# Patient Record
Sex: Female | Born: 1955 | Race: Asian | Hispanic: No | Marital: Married | Smoking: Never smoker
Health system: Southern US, Community
[De-identification: ages and names within clinical notes are randomized; demographics above are authoritative.]

## PROBLEM LIST (undated history)

## (undated) DIAGNOSIS — K759 Inflammatory liver disease, unspecified: Secondary | ICD-10-CM

## (undated) DIAGNOSIS — N939 Abnormal uterine and vaginal bleeding, unspecified: Secondary | ICD-10-CM

## (undated) DIAGNOSIS — Z79890 Hormone replacement therapy (postmenopausal): Secondary | ICD-10-CM

## (undated) DIAGNOSIS — D249 Benign neoplasm of unspecified breast: Secondary | ICD-10-CM

## (undated) DIAGNOSIS — E785 Hyperlipidemia, unspecified: Secondary | ICD-10-CM

## (undated) DIAGNOSIS — I10 Essential (primary) hypertension: Secondary | ICD-10-CM

## (undated) DIAGNOSIS — T7840XA Allergy, unspecified, initial encounter: Secondary | ICD-10-CM

## (undated) HISTORY — PX: BREAST BIOPSY: SHX20

---

## 2015-12-14 DIAGNOSIS — E785 Hyperlipidemia, unspecified: Secondary | ICD-10-CM | POA: Diagnosis not present

## 2015-12-14 DIAGNOSIS — Z79899 Other long term (current) drug therapy: Secondary | ICD-10-CM | POA: Diagnosis not present

## 2015-12-14 DIAGNOSIS — K640 First degree hemorrhoids: Secondary | ICD-10-CM | POA: Diagnosis not present

## 2015-12-14 DIAGNOSIS — Z79890 Hormone replacement therapy (postmenopausal): Secondary | ICD-10-CM | POA: Diagnosis not present

## 2015-12-14 DIAGNOSIS — I10 Essential (primary) hypertension: Secondary | ICD-10-CM | POA: Diagnosis not present

## 2015-12-14 DIAGNOSIS — D125 Benign neoplasm of sigmoid colon: Secondary | ICD-10-CM | POA: Diagnosis not present

## 2015-12-14 DIAGNOSIS — Z1211 Encounter for screening for malignant neoplasm of colon: Secondary | ICD-10-CM | POA: Diagnosis present

## 2015-12-15 ENCOUNTER — Ambulatory Visit: Payer: Managed Care, Other (non HMO) | Admitting: Anesthesiology

## 2015-12-15 ENCOUNTER — Encounter: Payer: Self-pay | Admitting: Anesthesiology

## 2015-12-15 ENCOUNTER — Ambulatory Visit
Admission: RE | Admit: 2015-12-15 | Discharge: 2015-12-15 | Disposition: A | Discharge status: Home / Self Care | Payer: Managed Care, Other (non HMO) | Source: Ambulatory Visit | Attending: Unknown Physician Specialty | Admitting: Unknown Physician Specialty

## 2015-12-15 ENCOUNTER — Encounter
Admission: RE | Disposition: A | Discharge status: Home / Self Care | Payer: Self-pay | Source: Ambulatory Visit | Attending: Unknown Physician Specialty

## 2015-12-15 DIAGNOSIS — D125 Benign neoplasm of sigmoid colon: Secondary | ICD-10-CM | POA: Insufficient documentation

## 2015-12-15 DIAGNOSIS — K640 First degree hemorrhoids: Secondary | ICD-10-CM | POA: Insufficient documentation

## 2015-12-15 DIAGNOSIS — Z1211 Encounter for screening for malignant neoplasm of colon: Secondary | ICD-10-CM | POA: Insufficient documentation

## 2015-12-15 DIAGNOSIS — I10 Essential (primary) hypertension: Secondary | ICD-10-CM | POA: Insufficient documentation

## 2015-12-15 DIAGNOSIS — Z79890 Hormone replacement therapy (postmenopausal): Secondary | ICD-10-CM | POA: Insufficient documentation

## 2015-12-15 DIAGNOSIS — E785 Hyperlipidemia, unspecified: Secondary | ICD-10-CM | POA: Insufficient documentation

## 2015-12-15 DIAGNOSIS — Z79899 Other long term (current) drug therapy: Secondary | ICD-10-CM | POA: Insufficient documentation

## 2015-12-15 HISTORY — PX: COLONOSCOPY WITH PROPOFOL: SHX5780

## 2015-12-15 HISTORY — DX: Allergy, unspecified, initial encounter: T78.40XA

## 2015-12-15 HISTORY — DX: Hormone replacement therapy: Z79.890

## 2015-12-15 HISTORY — DX: Inflammatory liver disease, unspecified: K75.9

## 2015-12-15 HISTORY — DX: Abnormal uterine and vaginal bleeding, unspecified: N93.9

## 2015-12-15 HISTORY — DX: Essential (primary) hypertension: I10

## 2015-12-15 HISTORY — DX: Benign neoplasm of unspecified breast: D24.9

## 2015-12-15 HISTORY — DX: Hyperlipidemia, unspecified: E78.5

## 2015-12-15 SURGERY — COLONOSCOPY WITH PROPOFOL
Anesthesia: General

## 2015-12-15 MED ORDER — PROPOFOL 10 MG/ML IV BOLUS
INTRAVENOUS | Status: DC | PRN
  Administered 2015-12-15: 50 mg via INTRAVENOUS

## 2015-12-15 MED ORDER — SODIUM CHLORIDE 0.9 % IV SOLN
INTRAVENOUS | Status: DC
  Administered 2015-12-15: 1000 mL via INTRAVENOUS

## 2015-12-15 MED ORDER — SODIUM CHLORIDE 0.9 % IV SOLN: INTRAVENOUS | Status: DC

## 2015-12-15 MED ORDER — PROPOFOL 500 MG/50ML IV EMUL
INTRAVENOUS | Status: DC | PRN
  Administered 2015-12-15: 150 ug/kg/min via INTRAVENOUS

## 2015-12-15 NOTE — Transfer of Care (Signed)
Immediate Anesthesia Transfer of Care Note  Patient: Terri Weeks  Procedure(s) Performed: Procedure(s): COLONOSCOPY WITH PROPOFOL (N/A)  Patient Location: Endoscopy Unit  Anesthesia Type:General  Level of Consciousness: sedated  Airway & Oxygen Therapy: Patient Spontanous Breathing and Patient connected to nasal cannula oxygen  Post-op Assessment: Report given to RN and Post -op Vital signs reviewed and stable  Post vital signs: Reviewed and stable  Last Vitals:  Filed Vitals:   12/15/15 1330  BP: 130/96  Pulse: 90  Temp: 36.4 C  Resp: 20    Complications: No apparent anesthesia complications

## 2015-12-15 NOTE — Anesthesia Preprocedure Evaluation (Signed)
Anesthesia Evaluation  Patient identified by MRN, date of birth, ID band Patient awake    Reviewed: Allergy & Precautions, H&P , NPO status , Patient's Chart, lab work & pertinent test results  History of Anesthesia Complications Negative for: history of anesthetic complications  Airway Mallampati: III  TM Distance: >3 FB Neck ROM: full    Dental  (+) Implants, Poor Dentition   Pulmonary neg pulmonary ROS, neg shortness of breath,    Pulmonary exam normal breath sounds clear to auscultation       Cardiovascular Exercise Tolerance: Good hypertension, (-) angina(-) Past MI Normal cardiovascular exam Rhythm:regular Rate:Normal     Neuro/Psych negative neurological ROS  negative psych ROS   GI/Hepatic negative GI ROS, (+) Hepatitis -  Endo/Other  negative endocrine ROS  Renal/GU negative Renal ROS  negative genitourinary   Musculoskeletal   Abdominal   Peds  Hematology negative hematology ROS (+)   Anesthesia Other Findings Past Medical History:   Hormone replacement therapy                                  Hypertension                                                 Hyperlipemia                                                 Allergic state                                               Breast fibroadenoma                                          Abnormal uterine bleeding                                    Hepatitis                                                   Past Surgical History:   BREAST LUMPECTOMY                                            BMI    Body Mass Index   20.98 kg/m 2      Reproductive/Obstetrics negative OB ROS                             Anesthesia Physical Anesthesia Plan  ASA: III  Anesthesia Plan: General   Post-op Pain Management:    Induction:  Airway Management Planned:   Additional Equipment:   Intra-op Plan:   Post-operative Plan:    Informed Consent: I have reviewed the patients History and Physical, chart, labs and discussed the procedure including the risks, benefits and alternatives for the proposed anesthesia with the patient or authorized representative who has indicated his/her understanding and acceptance.   Dental Advisory Given  Plan Discussed with: Anesthesiologist, CRNA and Surgeon  Anesthesia Plan Comments:         Anesthesia Quick Evaluation

## 2015-12-15 NOTE — Op Note (Signed)
[facility] Patient Name: Terri Weeks Procedure Date: 12/15/2015 1:53 PM MRN: [id number] Account #: [id number] Date of Birth: 05-30-1956 Admit Type: Outpatient Age: 59 Room: [facility] ENDO ROOM 4 Gender: Female Note Status: Finalized Procedure:         Colonoscopy Indications:       Screening for colorectal malignant neoplasm Providers:         Manya Silvas, MD Referring MD:      Sofie Hartigan (Referring MD) Medicines:         Propofol per Anesthesia Complications:     No immediate complications. Procedure:         Pre-Anesthesia Assessment:                    - After reviewing the risks and benefits, the patient was                     deemed in satisfactory condition to undergo the procedure.                    After obtaining informed consent, the colonoscope was                     passed under direct vision. Throughout the procedure, the                     patient's blood pressure, pulse, and oxygen saturations                     were monitored continuously. The Colonoscope was                     introduced through the anus and advanced to the the cecum,                     identified by appendiceal orifice and ileocecal valve. The                     colonoscopy was performed without difficulty. The patient                     tolerated the procedure well. The quality of the bowel                     preparation was excellent. Findings:      A small polyp was found in the proximal sigmoid colon. The polyp was       sessile. The polyp was removed with a cold snare. Resection and       retrieval were complete.      A diminutive polyp was found in the sigmoid colon. The polyp was       sessile. The polyp was removed with a cold snare. Resection and       retrieval were complete.Bite twice with forceps.      Internal hemorrhoids were found during endoscopy. The hemorrhoids were       small and Grade I (internal hemorrhoids that do not  prolapse).      The exam was otherwise without abnormality. Impression:        - One small polyp in the proximal sigmoid colon. Resected                     and retrieved.                    -  One diminutive polyp in the sigmoid colon. Resected and                     retrieved.                    - Internal hemorrhoids.                    - The examination was otherwise normal. Recommendation:    - Await pathology results. Manya Silvas, MD 12/15/2015 2:18:52 PM This report has been signed electronically. Number of Addenda: 0 Note Initiated On: 12/15/2015 1:53 PM Scope Withdrawal Time: 0 hours 12 minutes 28 seconds  Total Procedure Duration: 0 hours 18 minutes 37 seconds       [facility]

## 2015-12-15 NOTE — Anesthesia Postprocedure Evaluation (Signed)
Anesthesia Post Note  Patient: Terri Weeks  Procedure(s) Performed: Procedure(s) (LRB): COLONOSCOPY WITH PROPOFOL (N/A)  Patient location during evaluation: Endoscopy Anesthesia Type: General Level of consciousness: awake and alert Pain management: pain level controlled Vital Signs Assessment: post-procedure vital signs reviewed and stable Respiratory status: spontaneous breathing, nonlabored ventilation, respiratory function stable and patient connected to nasal cannula oxygen Cardiovascular status: blood pressure returned to baseline and stable Postop Assessment: no signs of nausea or vomiting Anesthetic complications: no    Last Vitals:  Filed Vitals:   12/15/15 1446 12/15/15 1456  BP: 132/96 133/84  Pulse: 63 62  Temp:    Resp: 17 14    Last Pain: There were no vitals filed for this visit.               Precious Haws Piscitello

## 2015-12-15 NOTE — H&P (Signed)
   Primary Care Physician:  [facility], MD Primary Gastroenterologist:  Dr. Vira Agar  Pre-Procedure History & Physical: HPI:  Terri Weeks is a 59 y.o. female is here for an colonoscopy.   Past Medical History  Diagnosis Date  . Hormone replacement therapy   . Hypertension   . Hyperlipemia   . Allergic state   . Breast fibroadenoma   . Abnormal uterine bleeding   . Hepatitis     Past Surgical History  Procedure Laterality Date  . Breast lumpectomy      Prior to Admission medications   Medication Sig Start Date End Date Taking? Authorizing Provider  ascorbic acid (VITAMIN C) 1000 MG tablet Take 1,000 mg by mouth daily.   Yes Historical Provider, MD  calcium carbonate (OSCAL) 1500 (600 CA) MG TABS tablet Take 1,500 mg by mouth 2 (two) times daily with a meal.   Yes Historical Provider, MD  estradiol (ESTRACE) 1 MG tablet Take 1 mg by mouth daily.   Yes Historical Provider, MD  fexofenadine (ALLEGRA) 180 MG tablet Take 180 mg by mouth daily.   Yes Historical Provider, MD  medroxyPROGESTERone (PROVERA) 2.5 MG tablet Take 2.5 mg by mouth daily.   Yes Historical Provider, MD  rosuvastatin (CRESTOR) 5 MG tablet Take 5 mg by mouth daily.   Yes Historical Provider, MD  valsartan-hydrochlorothiazide (DIOVAN-HCT) 160-25 MG tablet Take 1 tablet by mouth daily.   Yes Historical Provider, MD    Allergies as of 11/25/2015  . (Not on File)    History reviewed. No pertinent family history.  Social History   Social History  . Marital Status: Married    Spouse Name: N/A  . Number of Children: N/A  . Years of Education: N/A   Occupational History  . Not on file.   Social History Main Topics  . Smoking status: Never Smoker   . Smokeless tobacco: Not on file  . Alcohol Use: Not on file  . Drug Use: Not on file  . Sexual Activity: Not on file   Other Topics Concern  . Not on file   Social History Narrative    Review of Systems: See HPI, otherwise negative  ROS  Physical Exam: BP 130/96 mmHg  Pulse 90  Temp(Src) 97.5 F (36.4 C) (Oral)  Resp 20  Ht 5\' 1"  (1.549 m)  Wt 50.349 kg (111 lb)  BMI 20.98 kg/m2 General:   Alert,  pleasant and cooperative in NAD Head:  Normocephalic and atraumatic. Neck:  Supple; no masses or thyromegaly. Lungs:  Clear throughout to auscultation.    Heart:  Regular rate and rhythm. Abdomen:  Soft, nontender and nondistended. Normal bowel sounds, without guarding, and without rebound.   Neurologic:  Alert and  oriented x4;  grossly normal neurologically.  Impression/Plan: Terri Weeks is here for an colonoscopy to be performed for screening  Risks, benefits, limitations, and alternatives regarding  colonoscopy have been reviewed with the patient.  Questions have been answered.  All parties agreeable.   Gaylyn Cheers, MD  12/15/2015, 1:49 PM

## 2015-12-16 ENCOUNTER — Encounter: Payer: Self-pay | Admitting: Unknown Physician Specialty

## 2015-12-17 LAB — SURGICAL PATHOLOGY

## 2017-11-30 ENCOUNTER — Other Ambulatory Visit: Payer: Self-pay | Admitting: Family Medicine

## 2017-11-30 DIAGNOSIS — Z1231 Encounter for screening mammogram for malignant neoplasm of breast: Secondary | ICD-10-CM

## 2017-12-26 ENCOUNTER — Ambulatory Visit
Admission: RE | Admit: 2017-12-26 | Discharge: 2017-12-26 | Disposition: A | Discharge status: Home / Self Care | Payer: Managed Care, Other (non HMO) | Source: Ambulatory Visit | Attending: Family Medicine | Admitting: Family Medicine

## 2017-12-26 DIAGNOSIS — Z1231 Encounter for screening mammogram for malignant neoplasm of breast: Secondary | ICD-10-CM | POA: Insufficient documentation

## 2018-01-01 ENCOUNTER — Other Ambulatory Visit: Payer: Self-pay | Admitting: *Deleted

## 2018-01-01 ENCOUNTER — Inpatient Hospital Stay
Admission: RE | Admit: 2018-01-01 | Discharge: 2018-01-01 | Disposition: A | Discharge status: Home / Self Care | Payer: Self-pay | Source: Ambulatory Visit | Attending: *Deleted | Admitting: *Deleted

## 2018-01-01 DIAGNOSIS — Z9289 Personal history of other medical treatment: Secondary | ICD-10-CM

## 2019-08-24 ENCOUNTER — Ambulatory Visit
Admission: EM | Admit: 2019-08-24 | Discharge: 2019-08-24 | Disposition: A | Discharge status: Home / Self Care | Payer: Managed Care, Other (non HMO) | Attending: Emergency Medicine | Admitting: Emergency Medicine

## 2019-08-24 ENCOUNTER — Other Ambulatory Visit: Payer: Self-pay

## 2019-08-24 ENCOUNTER — Encounter: Payer: Self-pay | Admitting: Emergency Medicine

## 2019-08-24 DIAGNOSIS — R197 Diarrhea, unspecified: Secondary | ICD-10-CM | POA: Diagnosis not present

## 2019-08-24 DIAGNOSIS — R109 Unspecified abdominal pain: Secondary | ICD-10-CM

## 2019-08-24 LAB — GASTROINTESTINAL PANEL BY PCR, STOOL (REPLACES STOOL CULTURE)

## 2019-08-24 LAB — CBC WITH DIFFERENTIAL/PLATELET
Abs Immature Granulocytes: 0.03 10*3/uL (ref 0.00–0.07)
Basophils Absolute: 0 10*3/uL (ref 0.0–0.1)
Basophils Relative: 0 %
Eosinophils Absolute: 0.1 10*3/uL (ref 0.0–0.5)
Eosinophils Relative: 1 %
HCT: 42.1 % (ref 36.0–46.0)
Hemoglobin: 14.4 g/dL (ref 12.0–15.0)
Immature Granulocytes: 0 %
Lymphocytes Relative: 10 %
Lymphs Abs: 1.2 10*3/uL (ref 0.7–4.0)
MCH: 28.1 pg (ref 26.0–34.0)
MCHC: 34.2 g/dL (ref 30.0–36.0)
MCV: 82.1 fL (ref 80.0–100.0)
Monocytes Absolute: 1.1 10*3/uL — ABNORMAL HIGH (ref 0.1–1.0)
Monocytes Relative: 9 %
Neutro Abs: 9.3 10*3/uL — ABNORMAL HIGH (ref 1.7–7.7)
Neutrophils Relative %: 80 %
Platelets: 310 10*3/uL (ref 150–400)
RBC: 5.13 MIL/uL — ABNORMAL HIGH (ref 3.87–5.11)
RDW: 13.8 % (ref 11.5–15.5)
WBC: 11.7 10*3/uL — ABNORMAL HIGH (ref 4.0–10.5)
nRBC: 0 % (ref 0.0–0.2)

## 2019-08-24 LAB — BASIC METABOLIC PANEL
Anion gap: 10 (ref 5–15)
BUN: 12 mg/dL (ref 8–23)
CO2: 28 mmol/L (ref 22–32)
Calcium: 9.2 mg/dL (ref 8.9–10.3)
Chloride: 99 mmol/L (ref 98–111)
Creatinine, Ser: 0.67 mg/dL (ref 0.44–1.00)
GFR calc Af Amer: >60 mL/min (ref >60)
GFR calc non Af Amer: >60 mL/min (ref >60)
Glucose, Bld: 124 mg/dL — ABNORMAL HIGH (ref 70–99)
Potassium: 3.7 mmol/L (ref 3.5–5.1)
Sodium: 137 mmol/L (ref 135–145)

## 2019-08-24 MED ORDER — AZITHROMYCIN 250 MG PO TABS: 500.0000 mg | ORAL_TABLET | Freq: Every day | ORAL | 0 refills | Status: AC

## 2019-08-24 NOTE — ED Triage Notes (Addendum)
Patient c/o diarrhea off and on that started Thursday night.  Patient states that she got back from [place] on Friday. Patient reports some stomach pain that started a day later.  Patient states she has been taking Imodium at home for her diarrhea.

## 2019-08-24 NOTE — ED Provider Notes (Signed)
[facility] ____________________________________________  Time seen: Approximately 11:02 AM  I have reviewed the triage vital signs and the nursing notes.   HISTORY  Chief Complaint Diarrhea and Abdominal Pain   HPI Terri Weeks is a 63 y.o. female presenting for evaluation of abdominal discomfort and diarrhea.  Patient reports she was traveling in [place] this past week as she frequently travels.  States she had gotten frozen dumplings on Wednesday and put them in her mini fridge.  States later that night she ate them and first thing Thursday morning began having abdominal cramping with intermittent diarrhea.  States she had 3-4 episodes of diarrhea on Thursday and Friday.  States it has since calmed down some, but still has some diarrhea after eating.  On Friday and Saturday as well as today still has some intermittent cramping, occasionally sharp, diffuse abdominal discomfort.  States no pain at this time.  States diarrhea is loose.  Denies blood in stool.  No vomiting nausea or vomiting.  No accompanying fevers.  No known sick contacts.  Patient felt this is likely due to the food but wanted to be evaluated.  Has taken Imodium over-the-counter Friday and Saturday without resolution.  Denies recent cough, congestion, chest pain, shortness of breath, weakness, sore throat or fevers.  Continues to drink fluids well, but not eating as much due to causing diarrhea.  Reports otherwise doing well.  Sofie Hartigan, MD: PCP   Past Medical History:  Diagnosis Date  . Abnormal uterine bleeding   . Allergic state   . Breast fibroadenoma   . Hepatitis   . Hormone replacement therapy   . Hyperlipemia   . Hypertension     There are no active problems to display for this patient.   Past Surgical History:  Procedure Laterality Date  . BREAST BIOPSY Left    neg  . COLONOSCOPY WITH PROPOFOL N/A 12/15/2015   Procedure: COLONOSCOPY WITH PROPOFOL;  Surgeon: Manya Silvas, MD;   Location: [facility] ENDOSCOPY;  Service: Endoscopy;  Laterality: N/A;     No current facility-administered medications for this encounter.   Current Outpatient Medications:  .  ascorbic acid (VITAMIN C) 1000 MG tablet, Take 1,000 mg by mouth daily., Disp: , Rfl:  .  calcium carbonate (OSCAL) 1500 (600 CA) MG TABS tablet, Take 1,500 mg by mouth 2 (two) times daily with a meal., Disp: , Rfl:  .  estradiol (ESTRACE) 1 MG tablet, Take 1 mg by mouth daily., Disp: , Rfl:  .  fexofenadine (ALLEGRA) 180 MG tablet, Take 180 mg by mouth daily., Disp: , Rfl:  .  medroxyPROGESTERone (PROVERA) 2.5 MG tablet, Take 2.5 mg by mouth daily., Disp: , Rfl:  .  rosuvastatin (CRESTOR) 5 MG tablet, Take 5 mg by mouth daily., Disp: , Rfl:  .  valsartan-hydrochlorothiazide (DIOVAN-HCT) 160-25 MG tablet, Take 1 tablet by mouth daily., Disp: , Rfl:  .  azithromycin (ZITHROMAX Z-PAK) 250 MG tablet, Take 2 tablets (500 mg total) by mouth daily for 3 days. Take 2 tablets (500 mg) on  Day 1,  followed by 1 tablet (250 mg) once daily on Days 2 through 5., Disp: 6 each, Rfl: 0  Allergies Patient has no known allergies.  Family History  Problem Relation Age of Onset  . Breast cancer Sister 69  . Breast cancer Cousin        mat cousin  . Hypertension Mother   . Hyperlipidemia Mother     Social History Social History   Tobacco  Use  . Smoking status: Never Smoker  . Smokeless tobacco: Never Used  Substance Use Topics  . Alcohol use: Yes  . Drug use: Never    Review of Systems Constitutional: No fever ENT: No sore throat. Cardiovascular: Denies chest pain. Respiratory: Denies shortness of breath. Gastrointestinal: As above.  Genitourinary: Negative for dysuria. Musculoskeletal: Negative for back pain. Skin: Negative for rash. Neurological: Negative for focal weakness or numbness.  ____________________________________________   PHYSICAL EXAM:  VITAL SIGNS: ED Triage Vitals  Enc Vitals Group     BP  08/24/19 1001 140/85     Pulse Rate 08/24/19 1001 83     Resp 08/24/19 1001 14     Temp 08/24/19 1001 98.5 F (36.9 C)     Temp Source 08/24/19 1001 Oral     SpO2 08/24/19 1001 100 %     Weight 08/24/19 0956 101 lb (45.8 kg)     Height 08/24/19 0956 5' (1.524 m)     Head Circumference --      Peak Flow --      Pain Score 08/24/19 0956 0     Pain Loc --      Pain Edu? --      Excl. in [place]? --     Constitutional: Alert and oriented. Well appearing and in no acute distress. Eyes: Conjunctivae are normal.  ENT      Head: Normocephalic and atraumatic. Cardiovascular: Normal rate, regular rhythm. Grossly normal heart sounds.  Good peripheral circulation. Respiratory: Normal respiratory effort without tachypnea nor retractions. Breath sounds are clear and equal bilaterally. No wheezes, rales, rhonchi. Gastrointestinal: Normal Bowel sounds.  No distention.  Abdomen soft and nontender.  No guarding.  No CVA tenderness. Musculoskeletal:  No midline cervical, thoracic or lumbar tenderness to palpation.  Steady gait. Neurologic:  Normal speech and language. Skin:  Skin is warm, dry and intact. No rash noted. Psychiatric: Mood and affect are normal. Speech and behavior are normal. Patient exhibits appropriate insight and judgment   ___________________________________________   LABS (all labs ordered are listed, but only abnormal results are displayed)  Labs Reviewed  CBC WITH DIFFERENTIAL/PLATELET - Abnormal; Notable for the following components:      Result Value   WBC 11.7 (*)    RBC 5.13 (*)    Neutro Abs 9.3 (*)    Monocytes Absolute 1.1 (*)    All other components within normal limits  BASIC METABOLIC PANEL - Abnormal; Notable for the following components:   Glucose, Bld 124 (*)    All other components within normal limits  NOVEL CORONAVIRUS, NAA (HOSP ORDER, SEND-OUT TO REF LAB; TAT 18-24 HRS)    PROCEDURES Procedures   INITIAL IMPRESSION / ASSESSMENT AND PLAN / ED COURSE   Pertinent labs & imaging results that were available during my care of the patient were reviewed by me and considered in my medical decision making (see chart for details).  Very well-appearing patient.  No acute distress.  Intermittent abdominal discomfort and diarrhea.  Suspect viral versus food related.  Denies fevers or bloody stool.  Patient did express as recent travel would like to have COVID-19 testing completed, COVID-19 testing completed, and advice given regarding this.  Labs reviewed and discussed with patient as WBC slightly elevated with left shift, will empirically treat with 3-day course of azithromycin and await GI panel stool test.  Patient will return within 24 hours.  Encourage rest, fluids, supportive care.  GI panel ordered.  Brat diet.  Close monitoring.Discussed  indication, risks and benefits of medications with patient.  Discussed follow up with Primary care physician this week. Discussed follow up and return parameters including no resolution or any worsening concerns. Patient verbalized understanding and agreed to plan.   ____________________________________________   FINAL CLINICAL IMPRESSION(S) / ED DIAGNOSES  Final diagnoses:  Diarrhea of presumed infectious origin  Abdominal discomfort     ED Discharge Orders         Ordered    azithromycin (ZITHROMAX Z-PAK) 250 MG tablet  Daily     08/24/19 1125           Note: This dictation was prepared with Dragon dictation along with smaller phrase technology. Any transcriptional errors that result from this process are unintentional.         Marylene Land, NP 08/24/19 1153

## 2019-08-24 NOTE — Discharge Instructions (Addendum)
Take medication as prescribed. Rest. Drink plenty of fluids. BRAT diet. Return stool sample.   Follow up with your primary care physician this week as needed. Return to Urgent care for new or worsening concerns.

## 2019-08-26 LAB — NOVEL CORONAVIRUS, NAA (HOSP ORDER, SEND-OUT TO REF LAB; TAT 18-24 HRS): SARS-CoV-2, NAA: NOT DETECTED

## 2019-10-07 ENCOUNTER — Other Ambulatory Visit: Payer: Self-pay | Admitting: Family Medicine

## 2019-10-07 DIAGNOSIS — R319 Hematuria, unspecified: Secondary | ICD-10-CM

## 2019-10-13 ENCOUNTER — Ambulatory Visit
Admission: RE | Admit: 2019-10-13 | Discharge: 2019-10-13 | Disposition: A | Discharge status: Home / Self Care | Payer: Managed Care, Other (non HMO) | Source: Ambulatory Visit | Attending: Family Medicine | Admitting: Family Medicine

## 2019-10-13 ENCOUNTER — Other Ambulatory Visit: Payer: Self-pay

## 2019-10-13 DIAGNOSIS — R319 Hematuria, unspecified: Secondary | ICD-10-CM

## 2019-10-14 ENCOUNTER — Ambulatory Visit: Admission: RE | Admit: 2019-10-14 | Payer: Managed Care, Other (non HMO) | Source: Ambulatory Visit

## 2020-03-22 ENCOUNTER — Other Ambulatory Visit: Payer: Self-pay | Admitting: Certified Nurse Midwife

## 2020-03-22 ENCOUNTER — Other Ambulatory Visit: Payer: Self-pay | Admitting: Family Medicine

## 2020-03-22 DIAGNOSIS — Z1231 Encounter for screening mammogram for malignant neoplasm of breast: Secondary | ICD-10-CM

## 2020-03-30 ENCOUNTER — Other Ambulatory Visit: Payer: Self-pay

## 2020-03-30 ENCOUNTER — Ambulatory Visit
Admission: RE | Admit: 2020-03-30 | Discharge: 2020-03-30 | Disposition: A | Discharge status: Home / Self Care | Payer: 59 | Source: Ambulatory Visit | Attending: Certified Nurse Midwife | Admitting: Certified Nurse Midwife

## 2020-03-30 DIAGNOSIS — Z1231 Encounter for screening mammogram for malignant neoplasm of breast: Secondary | ICD-10-CM | POA: Diagnosis not present

## 2020-10-31 ENCOUNTER — Encounter: Payer: Self-pay | Admitting: Emergency Medicine

## 2020-10-31 ENCOUNTER — Other Ambulatory Visit: Payer: Self-pay

## 2020-10-31 ENCOUNTER — Ambulatory Visit
Admission: EM | Admit: 2020-10-31 | Discharge: 2020-10-31 | Disposition: A | Discharge status: Home / Self Care | Payer: 59 | Attending: Internal Medicine | Admitting: Internal Medicine

## 2020-10-31 DIAGNOSIS — Z20822 Contact with and (suspected) exposure to covid-19: Secondary | ICD-10-CM | POA: Insufficient documentation

## 2020-10-31 NOTE — ED Triage Notes (Signed)
Patient here for COVID Test. Patient denies any COVID symptoms. Patient states that she needs this for travel.

## 2020-10-31 NOTE — Discharge Instructions (Signed)

## 2020-11-01 LAB — SARS CORONAVIRUS 2 (TAT 6-24 HRS): SARS Coronavirus 2: NEGATIVE

## 2021-12-06 ENCOUNTER — Other Ambulatory Visit: Payer: Self-pay | Admitting: Family Medicine

## 2021-12-06 DIAGNOSIS — Z1231 Encounter for screening mammogram for malignant neoplasm of breast: Secondary | ICD-10-CM

## 2021-12-14 ENCOUNTER — Other Ambulatory Visit: Payer: Self-pay

## 2021-12-14 ENCOUNTER — Ambulatory Visit
Admission: RE | Admit: 2021-12-14 | Discharge: 2021-12-14 | Disposition: A | Discharge status: Home / Self Care | Payer: 59 | Source: Ambulatory Visit | Attending: Family Medicine | Admitting: Family Medicine

## 2021-12-14 DIAGNOSIS — Z1231 Encounter for screening mammogram for malignant neoplasm of breast: Secondary | ICD-10-CM | POA: Insufficient documentation

## 2022-07-05 IMAGING — MG MM DIGITAL SCREENING BILAT W/ TOMO AND CAD
8 series · 9 of 24 positions shown · non-contrast
Comparison: Previous exam(s).

CLINICAL DATA: Screening.

EXAM:
DIGITAL SCREENING BILATERAL MAMMOGRAM WITH TOMOSYNTHESIS AND CAD
TECHNIQUE: Bilateral screening digital craniocaudal and mediolateral oblique
mammograms were obtained. Bilateral screening digital breast
tomosynthesis was performed. The images were evaluated with
computer-aided detection.

[R MLO synth-2D]
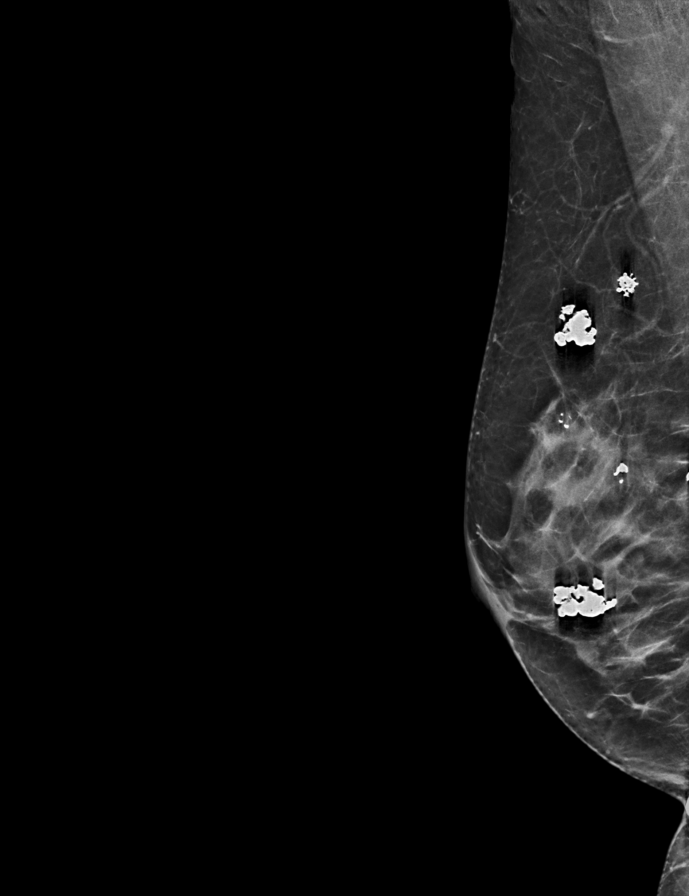

[R CC synth-2D]
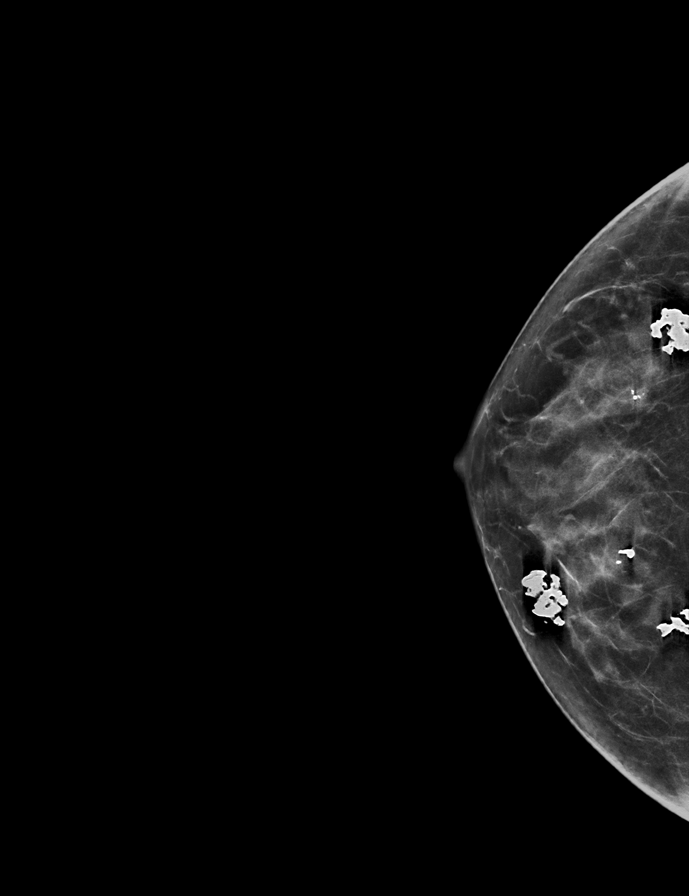

[L CC synth-2D]
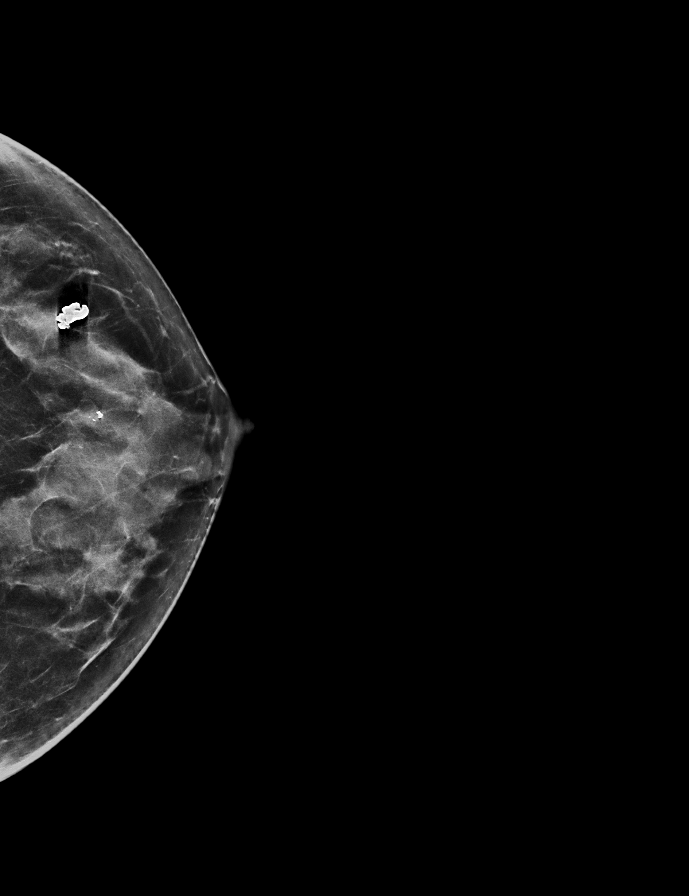

[L MLO synth-2D]
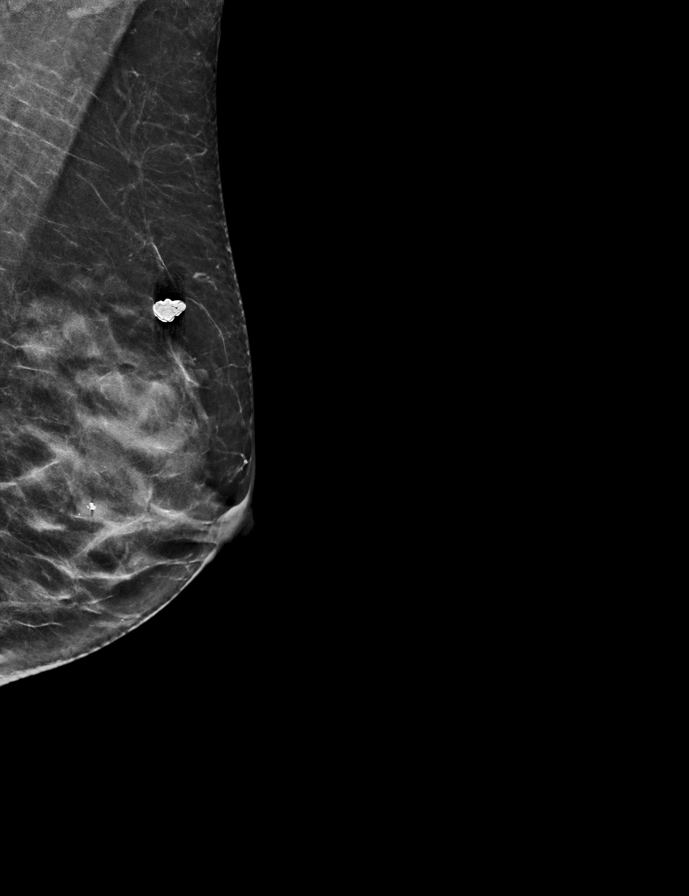

[L MLO tomo · 2 of 56 frames shown]
[frame 19/56]
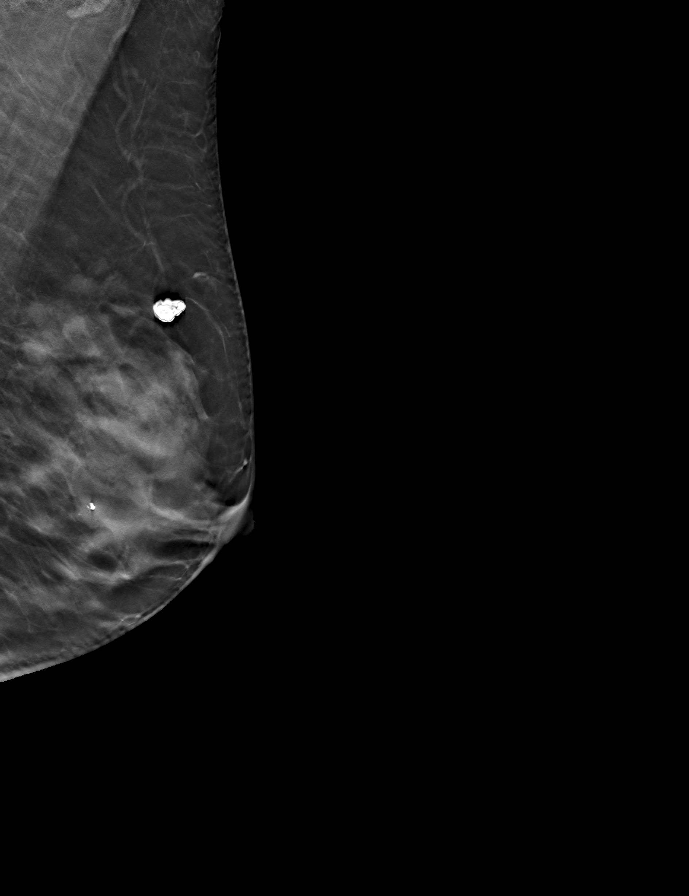
[frame 29/56]
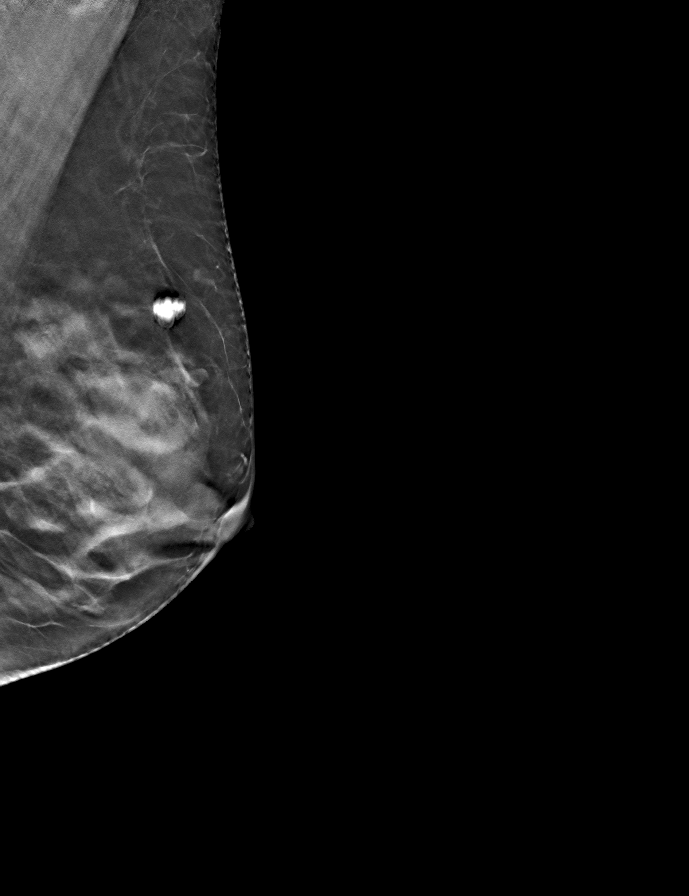

[R CC tomo · tomo slice 29/56.0]
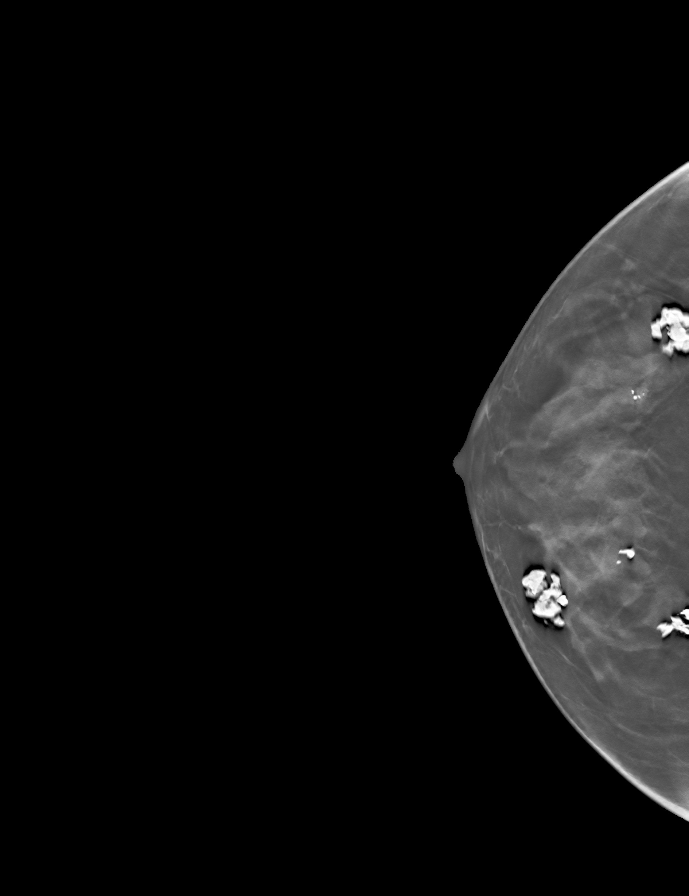

[L CC tomo · tomo slice 29/58.0]
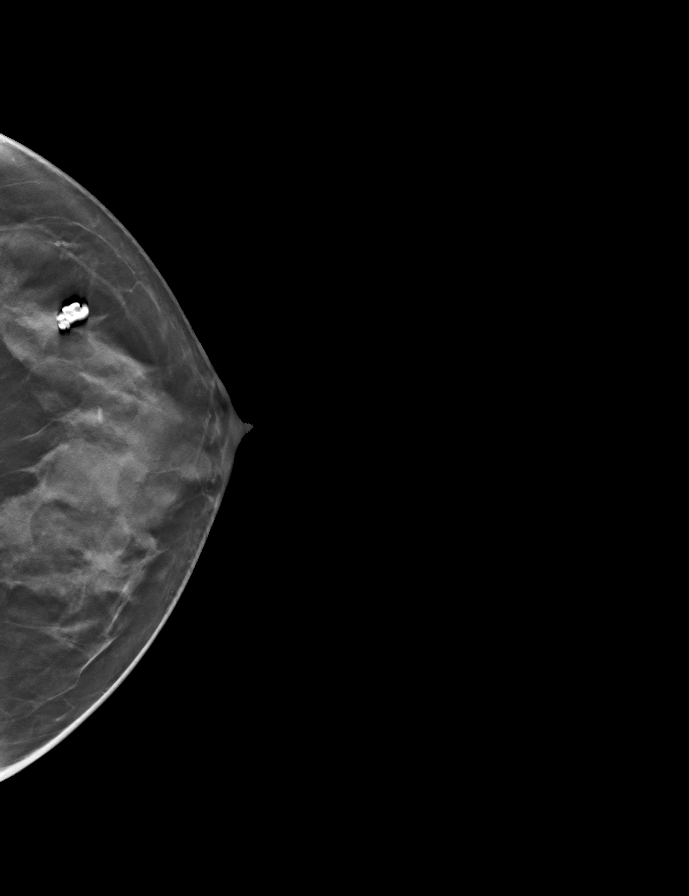

[R MLO tomo · tomo slice 27/52.0]
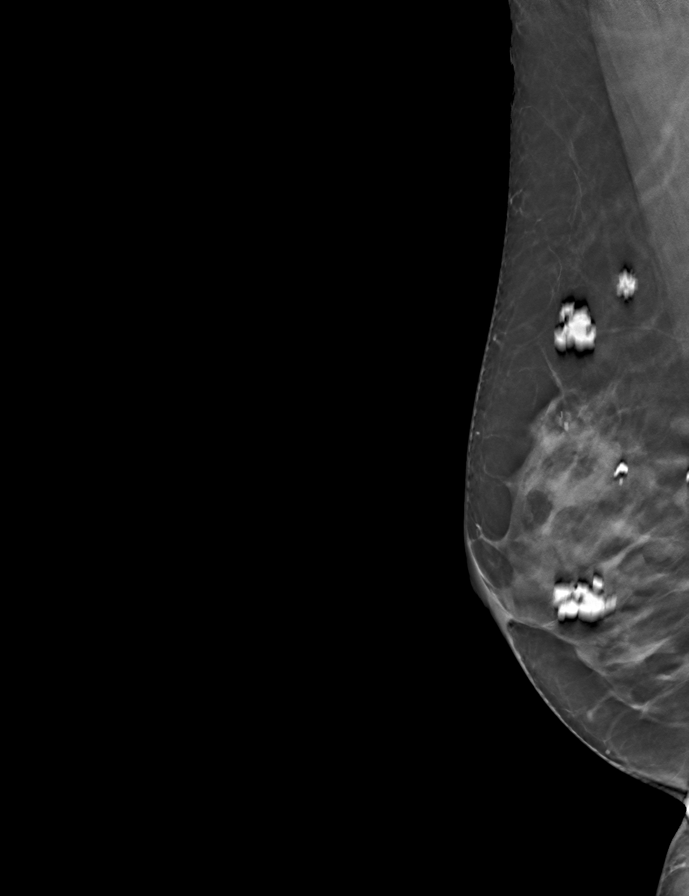

[9 of 24 positions shown; findings below may reference images not displayed]

ACR Breast Density Category c: The breast tissue is heterogeneously
dense, which may obscure small masses.
FINDINGS: There are no findings suspicious for malignancy.
IMPRESSION: No mammographic evidence of malignancy. A result letter of this
screening mammogram will be mailed directly to the patient.

RECOMMENDATION:
Screening mammogram in one year. (Code:Q3-W-BC3)

BI-RADS CATEGORY  1: Negative.

## 2023-04-24 ENCOUNTER — Other Ambulatory Visit: Payer: Self-pay | Admitting: Obstetrics and Gynecology

## 2023-04-24 DIAGNOSIS — Z1231 Encounter for screening mammogram for malignant neoplasm of breast: Secondary | ICD-10-CM

## 2023-05-10 ENCOUNTER — Encounter: Payer: Self-pay | Admitting: Obstetrics and Gynecology

## 2023-05-10 ENCOUNTER — Ambulatory Visit
Admission: RE | Admit: 2023-05-10 | Discharge: 2023-05-10 | Disposition: A | Discharge status: Home / Self Care | Payer: Medicare Other | Source: Ambulatory Visit | Attending: Obstetrics and Gynecology | Admitting: Obstetrics and Gynecology

## 2023-05-10 DIAGNOSIS — Z1231 Encounter for screening mammogram for malignant neoplasm of breast: Secondary | ICD-10-CM | POA: Diagnosis present

## 2024-06-10 ENCOUNTER — Other Ambulatory Visit: Payer: Self-pay | Admitting: Obstetrics and Gynecology

## 2024-06-10 DIAGNOSIS — Z1231 Encounter for screening mammogram for malignant neoplasm of breast: Secondary | ICD-10-CM

## 2024-10-28 ENCOUNTER — Ambulatory Visit

## 2024-11-25 ENCOUNTER — Ambulatory Visit
Admission: RE | Admit: 2024-11-25 | Discharge: 2024-11-25 | Disposition: A | Source: Ambulatory Visit | Attending: Obstetrics and Gynecology | Admitting: Obstetrics and Gynecology

## 2024-11-25 DIAGNOSIS — Z1231 Encounter for screening mammogram for malignant neoplasm of breast: Secondary | ICD-10-CM
